# Patient Record
Sex: Female | Born: 1955 | Race: White | Hispanic: Yes | Marital: Married | Smoking: Never smoker
Health system: Southern US, Community
[De-identification: ages and names within clinical notes are randomized; demographics above are authoritative.]

## PROBLEM LIST (undated history)

## (undated) DIAGNOSIS — B1920 Unspecified viral hepatitis C without hepatic coma: Secondary | ICD-10-CM

## (undated) HISTORY — PX: KIDNEY STONE SURGERY: SHX686

## (undated) HISTORY — PX: CHOLECYSTECTOMY: SHX55

## (undated) HISTORY — PX: TONSILLECTOMY: SUR1361

---

## 1998-12-16 ENCOUNTER — Other Ambulatory Visit: Admission: RE | Admit: 1998-12-16 | Discharge: 1998-12-16 | Payer: Self-pay | Admitting: *Deleted

## 1999-08-09 ENCOUNTER — Ambulatory Visit (HOSPITAL_COMMUNITY): Admission: RE | Admit: 1999-08-09 | Discharge: 1999-08-09 | Payer: Self-pay | Admitting: *Deleted

## 1999-10-17 ENCOUNTER — Ambulatory Visit (HOSPITAL_COMMUNITY): Admission: RE | Admit: 1999-10-17 | Discharge: 1999-10-18 | Payer: Self-pay

## 2000-02-03 ENCOUNTER — Other Ambulatory Visit: Admission: RE | Admit: 2000-02-03 | Discharge: 2000-02-03 | Payer: Self-pay | Admitting: *Deleted

## 2001-03-14 ENCOUNTER — Other Ambulatory Visit: Admission: RE | Admit: 2001-03-14 | Discharge: 2001-03-14 | Payer: Self-pay | Admitting: *Deleted

## 2002-07-23 ENCOUNTER — Encounter: Payer: Self-pay | Admitting: Family Medicine

## 2002-07-23 ENCOUNTER — Encounter: Admission: RE | Admit: 2002-07-23 | Discharge: 2002-07-23 | Payer: Self-pay | Admitting: Family Medicine

## 2003-09-09 ENCOUNTER — Other Ambulatory Visit: Admission: RE | Admit: 2003-09-09 | Discharge: 2003-09-09 | Payer: Self-pay | Admitting: Obstetrics and Gynecology

## 2003-09-11 ENCOUNTER — Encounter: Admission: RE | Admit: 2003-09-11 | Discharge: 2003-09-11 | Payer: Self-pay | Admitting: Obstetrics and Gynecology

## 2003-09-17 ENCOUNTER — Encounter: Admission: RE | Admit: 2003-09-17 | Discharge: 2003-09-17 | Payer: Self-pay | Admitting: Obstetrics and Gynecology

## 2004-10-07 ENCOUNTER — Encounter: Admission: RE | Admit: 2004-10-07 | Discharge: 2004-10-07 | Payer: Self-pay | Admitting: Family Medicine

## 2004-10-19 ENCOUNTER — Ambulatory Visit (HOSPITAL_COMMUNITY): Admission: RE | Admit: 2004-10-19 | Discharge: 2004-10-19 | Payer: Self-pay | Admitting: Urology

## 2004-10-19 ENCOUNTER — Ambulatory Visit (HOSPITAL_BASED_OUTPATIENT_CLINIC_OR_DEPARTMENT_OTHER): Admission: RE | Admit: 2004-10-19 | Discharge: 2004-10-19 | Payer: Self-pay | Admitting: Urology

## 2005-08-09 IMAGING — CT CT PELVIS W/O CM
1 series · 15 of 32 positions shown, 19 images · IV contrast (agent unspecified)
Comparison: none

CLINICAL DATA: Abdominal pain particularly left flank for several days. 
 CT ABDOMEN W/O CONTRAST:
 Multidetector helical scans through the abdomen were performed in the unenhanced state.  The lung bases are clear.  There is left hydronephrosis and hydroureter into the bladder.  CT of the pelvis will be performed.  No right hydronephrosis is seen.  No renal calculi are noted.  The remainder of the study shows the liver to appear normal in the unenhanced state.  Surgical clips are present from prior cholecystectomy.  The pancreas appears grossly normal although there is some prominence to the pancreatic duct.  The adrenal glands and spleen appear normal.  The abdominal aorta is normal in caliber.

[Series 2: renal stone · axial · 0.66mm/px · z∈[-311,-1]mm · 15 of 67 slices shown, 19 images]
[im 5/67  soft-tissue]
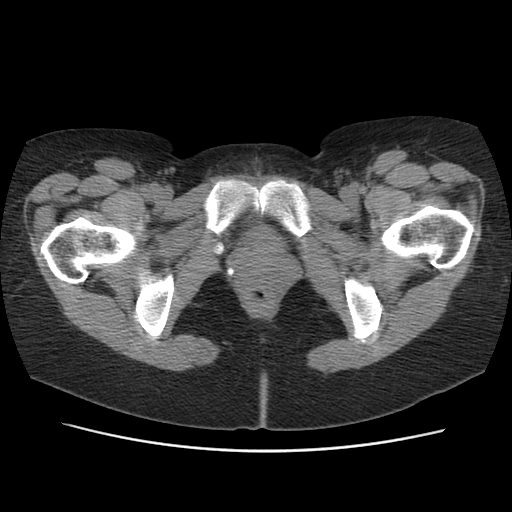
[im 5/67  bone]
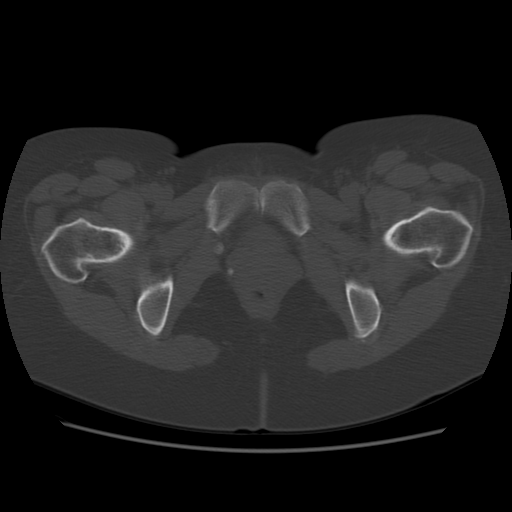
[im 9/67  soft-tissue]
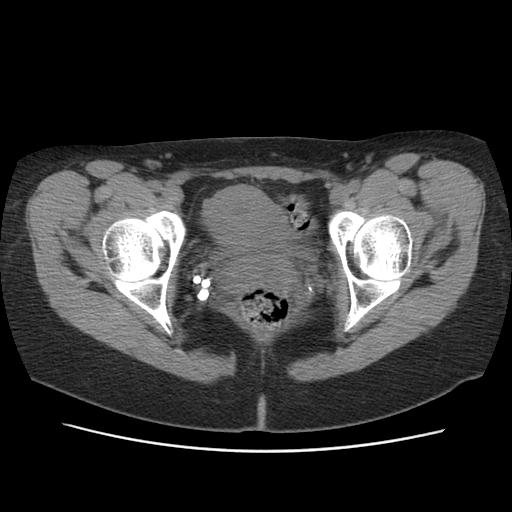
[im 13/67  soft-tissue]
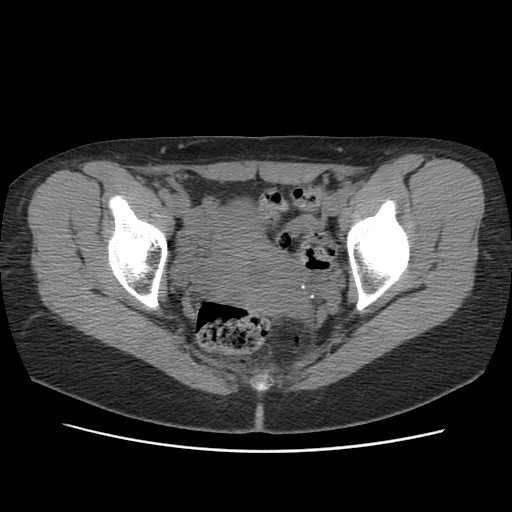
[im 20/67  soft-tissue]
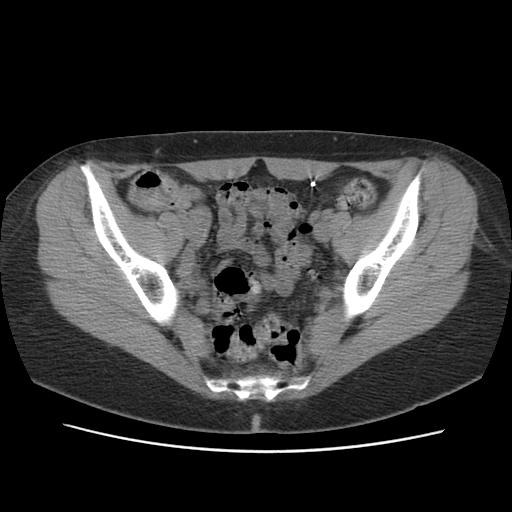
[im 24/67  soft-tissue]
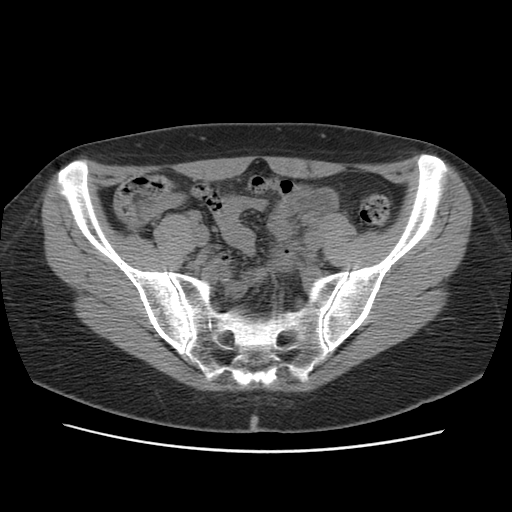
[im 28/67  soft-tissue]
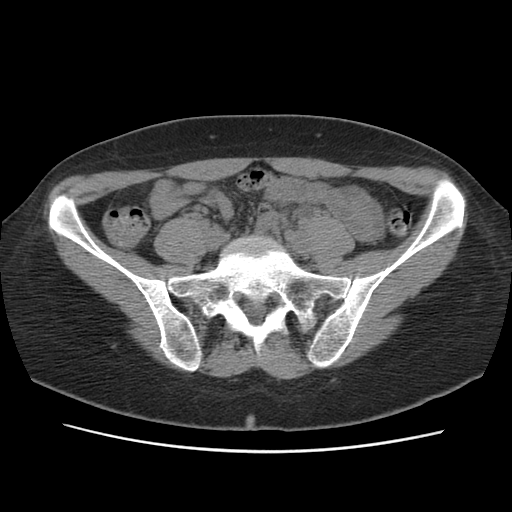
[im 35/67  soft-tissue]
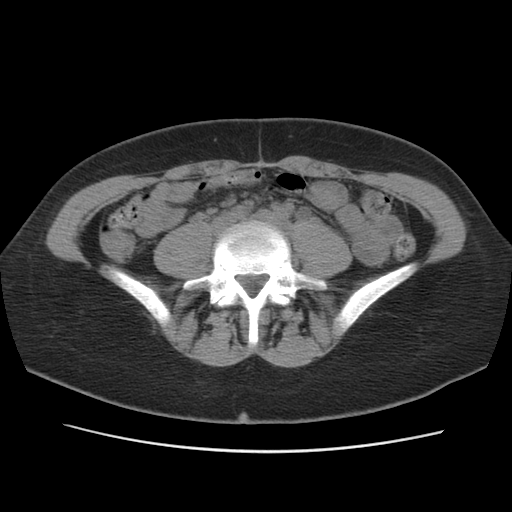
[im 39/67  soft-tissue]
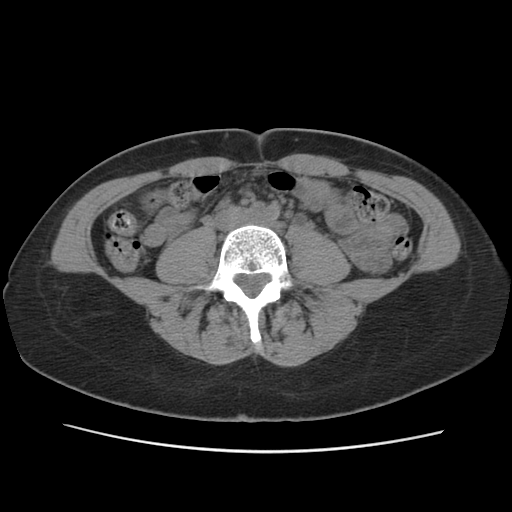
[im 43/67  soft-tissue]
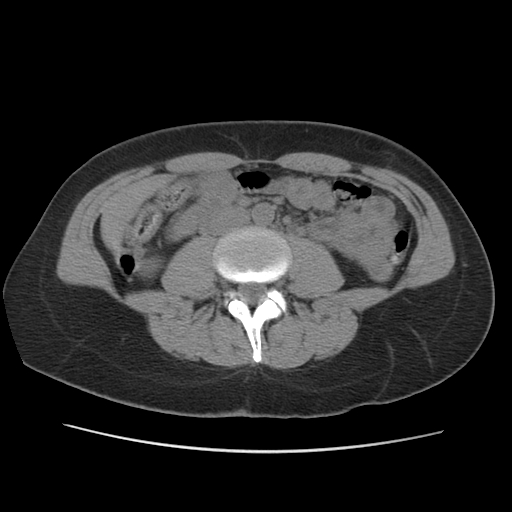
[im 43/67  bone]
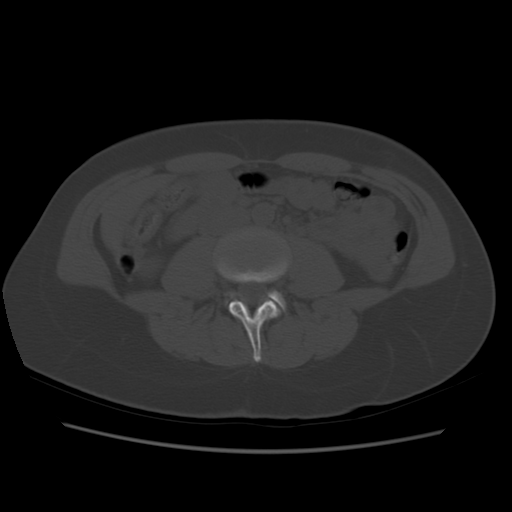
[im 47/67  soft-tissue]
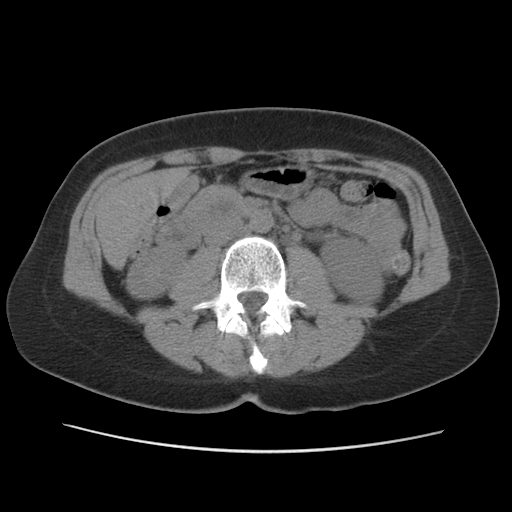
[im 54/67  soft-tissue]
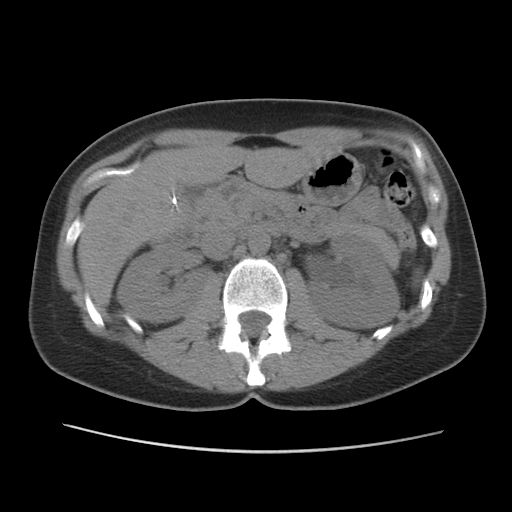
[im 58/67  soft-tissue]
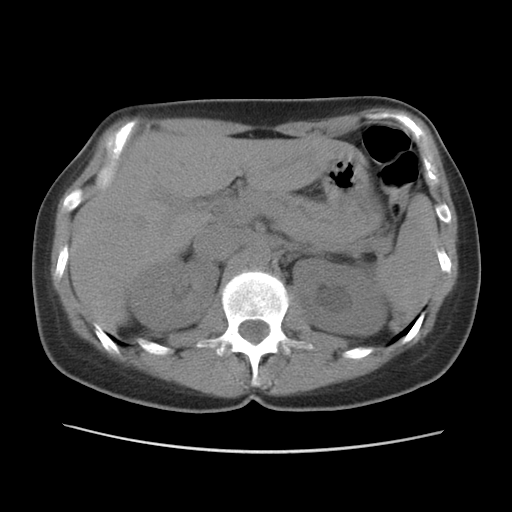
[im 58/67  lung]
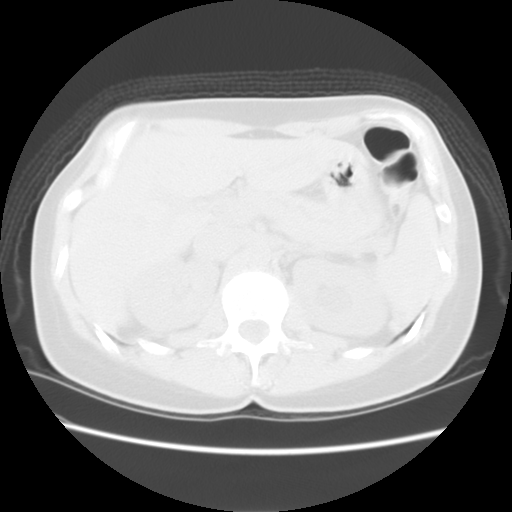
[im 60/67  lung]
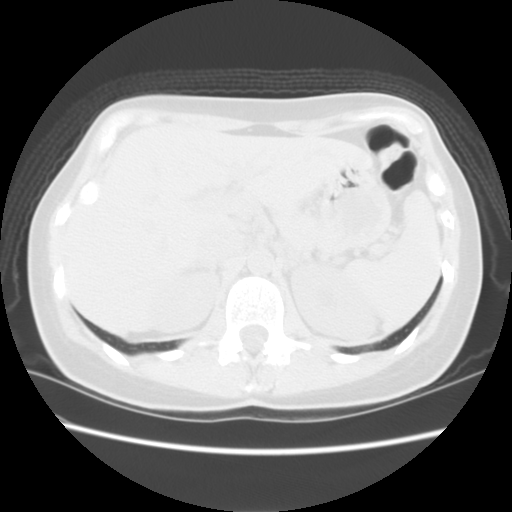
[im 62/67  soft-tissue]
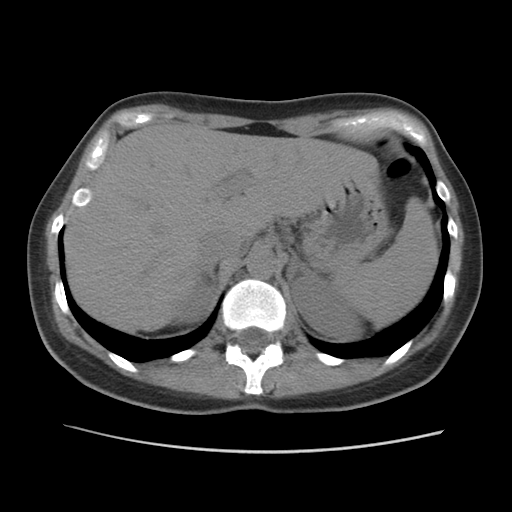
[im 62/67  lung]
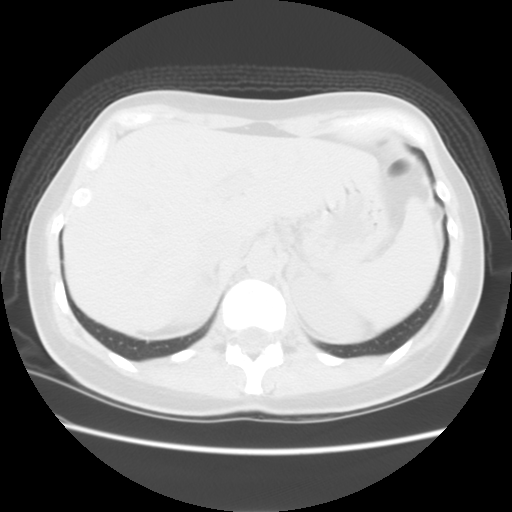
[im 64/67  lung]
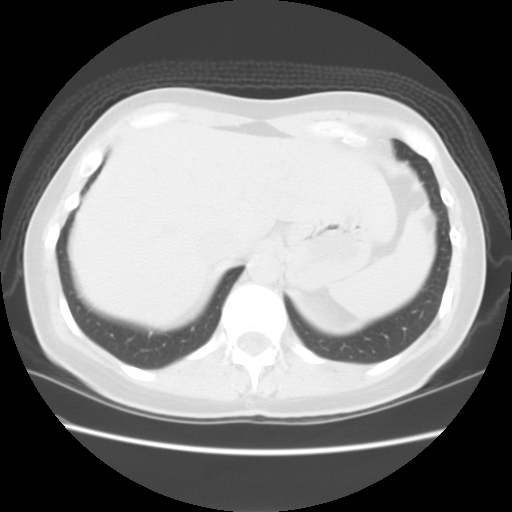

[15 of 32 positions shown; findings below may reference images not displayed]

IMPRESSION: 1.  Moderate left hydronephrosis and hydroureter.  CT of the pelvis is to be performed.  
 2.  No renal calculi are seen.  
 3.  Slightly prominent pancreatic duct.  Correlate clinically. 
 CT PELVIS W/CONTRAST: 
 Scans were continued through the pelvis in the unenhanced state.  The left hydronephrosis is due to a mid distal left ureteral calculus of 6 x 4mm.  The more distal left ureter is normal in caliber.  The right ureter is normal with no right ureteral calculi noted.  The urinary bladder is decompressed and unremarkable.  Calcified phleboliths are noted within the pelvis.  The uterus is normal in size.  Small ovarian follicles are noted.
IMPRESSION: Moderate left hydronephrosis and hydroureter to the mid pelvis where there is a 6 x 4mm obstructing left ureteral calculus present.

## 2006-06-01 ENCOUNTER — Encounter: Admission: RE | Admit: 2006-06-01 | Discharge: 2006-06-01 | Payer: Self-pay | Admitting: Family Medicine

## 2006-06-07 ENCOUNTER — Encounter: Admission: RE | Admit: 2006-06-07 | Discharge: 2006-06-07 | Payer: Self-pay | Admitting: Family Medicine

## 2006-12-10 ENCOUNTER — Emergency Department (HOSPITAL_COMMUNITY): Admission: EM | Admit: 2006-12-10 | Discharge: 2006-12-10 | Payer: Self-pay | Admitting: Emergency Medicine

## 2006-12-10 ENCOUNTER — Inpatient Hospital Stay (HOSPITAL_COMMUNITY): Admission: EM | Admit: 2006-12-10 | Discharge: 2006-12-11 | Payer: Self-pay | Admitting: Emergency Medicine

## 2008-10-06 ENCOUNTER — Encounter: Admission: RE | Admit: 2008-10-06 | Discharge: 2008-10-06 | Payer: Self-pay | Admitting: Family Medicine

## 2010-03-23 ENCOUNTER — Encounter: Admission: RE | Admit: 2010-03-23 | Discharge: 2010-03-23 | Payer: Self-pay | Admitting: Family Medicine

## 2010-11-26 ENCOUNTER — Encounter: Payer: Self-pay | Admitting: Obstetrics and Gynecology

## 2010-11-27 ENCOUNTER — Encounter: Payer: Self-pay | Admitting: Family Medicine

## 2011-03-24 NOTE — H&P (Signed)
NAMEGRACEMARIE, Tamara [name]                 ACCOUNT NO.:  [id number]   MEDICAL RECORD NO.:  [id number]          PATIENT TYPE:  LINP   LOCATION:                               FACILITY:  [facility]   PHYSICIAN:  Kela Millin, M.D.DATE OF BIRTH:  11/16/1955   DATE OF ADMISSION:  12/10/2006  DATE OF DISCHARGE:                              HISTORY & PHYSICAL   PRIMARY CARE PHYSICIAN:  Dr. Shaune Pollack.   CHIEF COMPLAINT:  1. Right upper quadrant pain.  2. Per ER physician, hypotension.   HISTORY OF PRESENT ILLNESS:  The patient is a 55 year old white female  with a past medical history significant for GERD, status post  cholecystectomy secondary to cholelithiasis/cholecystitis in 2005, also  status post left cystourethroscopy for ureterolithiasis in 2000, who  presents with above complaints.  The patient reports that she initially  came to the ER earlier this a.m. with complaints of nausea, vomiting as  well as diarrhea and just generalized pain - per ER physician, she was  hydrated, treated with IV analgesics and discharged home.  The patient  states that in the process of getting transferred from bed to wheelchair  and to her car she began feeling some right upper quadrant pain that was  precipitated by movement.  After she got home that right upper quadrant  pain was worse and so she came back to the ER.  Upon return to the ER,  the patient was found to have a low blood pressure of 86/53 and she  received IV Dilaudid as well for pain and upon recheck of the blood  pressures they had decreased even further to 76/33, so admission  requested to the [facility] for further evaluation and  management.  The patient describes the pain as somewhat sharp, in her  right upper quadrant and she only experiences the pain when she moves.  She states that she had multiple episodes of diarrhea last night, unable  to further qualify, but admits that it was non-bloody.  She states that  on the  day prior to admission she had gone all day long without eating  anything - states that it was just because she was upset, not because  she had any nausea or dysphagia or decreased appetite.  In the evening  she reports she ate some chocolate on an empty stomach and thought that  was the cause of the nausea and vomiting that she began experiencing.  She denies any sick contacts except that about 3 weeks ago her daughter  had similar symptoms but they had long resolved.  She admits to  subjective fevers.  She denies dysuria, cough, chest pain, melena and no  hematochezia.  The patient had a urinalysis done in the ER which was  negative for infection.  Upon return, she had a CT scan which showed no  acute findings and her point of care markers are negative, her lactic  acid level 0.8 and a chest x-ray negative for acute infiltrates.   PAST MEDICAL HISTORY:  As stated above.   MEDICATIONS:  1. Tums p.r.n.  2. Prilosec p.r.n.  3. Patient had also received Ativan, Zofran and morphine just prior to      being sent home from the ER earlier.   ALLERGIES:  MACROBID.   SOCIAL HISTORY:  She quit tobacco 24 years ago, occasional alcohol.   FAMILY HISTORY:  Cousin had type 1 diabetes - now deceased.   REVIEW OF SYSTEMS:  As per HPI.  Other review of systems negative.   PHYSICAL EXAM:  GENERAL:  The patient is an acutely ill middle-aged  white female in no respiratory distress.  VITAL SIGNS:  Temperature 100.3, blood pressure at its lowest 76/33, and  current blood pressure following IV fluids - 113/50, pulse 94,  respiratory rate 18, and O2 sat of 97%.  HEENT:  PERRL, EOMI, sclera anicteric, dry mucous membranes, no oral  exudates.  NECK:  Supple, no adenopathy and no thyromegaly, no JVD.  LUNGS:  Clear to auscultation bilaterally, no crackles or wheezes.  CARDIOVASCULAR:  Regular rate and rhythm, normal S1, S2.  ABDOMEN:  Soft, bowel sounds present, nontender, nondistended, no   organomegaly and no masses palpable.  EXTREMITIES:  No cyanosis and no edema.  NEURO:  She is alert and oriented x3.  Cranial nerves II-XII grossly  intact, nonfocal exam.   LABORATORY DATA:  A CT of abdomen and pelvis - no acute findings.  Stable intra/extrahepatic biliary duct dilatation.  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  The chest x-ray shows changes  compatible with COPD, no acute cardiopulmonary disease.  Point of care  markers are negative.  Lactic acid level 0.8.  White cell count is 11.1,  hemoglobin 14.6, hematocrit 42.3, platelet count 155,000, neutrophil  count 96%.  The sodium is 139 with a potassium of 3.7, chloride 105, CO2  of 25, glucose 145, BUN 11, creatinine of 0.86 and her total bilirubin  is 1.6, alkaline phosphatase is 79, SGOT 30, SGPT 26, calcium is 9.5.   ASSESSMENT AND PLAN:  1. Nausea, vomiting/diarrhea - patient also with right upper quadrant      pain as discussed above.  Probable gastroenteritis.  As noted      above, patient is status post cholecystectomy and CT scan of      abdomen negative for acute findings, urinalysis also negative.      Will also obtain a serum amylase and lipase to evaluate the      abdominal pain further.  Continue IV fluids, supportive care.  2. Hypotension - as discussed above.  Likely due to hypovolemia      secondary to the nausea, vomiting and diarrhea.  Will obtain stool      and blood cultures, also cardiac enzymes, follow hemoglobin and      hematocrit.  Continue hydration.  3. Gastroesophageal reflux disease - place patient on proton pump      inhibitor and follow.  4. History of ureterolithiasis - A CT scan of abdomen negative as      above.      Kela Millin, M.D.  Electronically Signed     ACV/MEDQ  D:  12/10/2006  T:  12/10/2006  Job:  [id number]  cc:   Duncan Dull, M.D.  Fax: [phone]

## 2011-03-24 NOTE — Op Note (Signed)
[name], [name]                 ACCOUNT NO.:  [id number]   MEDICAL RECORD NO.:  [id number]          PATIENT TYPE:  AMB   LOCATION:  [facility]                         FACILITY:  [facility]   PHYSICIAN:  Ronald L. Ovidio Hanger, M.D.DATE OF BIRTH:  05-11-1956   DATE OF PROCEDURE:  10/19/2004  DATE OF DISCHARGE:                                 OPERATIVE REPORT   PREOPERATIVE DIAGNOSES:  Left ureterolithiasis with hydronephrosis.   POSTOPERATIVE DIAGNOSES:  Left ureterolithiasis with hydronephrosis.   OPERATION:  Cystourethroscopy, left retrograde ureteropyelogram, left  ureteroscopy with holmium laser lithotripsy and stone basket extraction and  balloon dilatation of lower ureter and placement of left double J stent.   SURGEON:  Lucrezia Starch. Earlene Plater, M.D.   ANESTHESIA:  LMA.   ESTIMATED BLOOD LOSS:  Negligible.   TUBES:  26 cm 6 [place] Cook double pigtail stent.   COMPLICATIONS:  None.   INDICATIONS FOR PROCEDURE:  Tamara [name] is a lovely 55 year old white female  who essentially presented with left flank pain, nausea and vomiting. She was  found on CT scan to have a left hydronephrosis and a 4 x 6 mm stone at the  level of the vessels on the left side with obstruction.  She wanted to be  followed which we have carefully done. She has not had fever but she has had  intermittent pain and on re-CT scan she was found to have a highly  obstructed stone with hydronephrosis that appears to not be moving and we  figured leaving it further would cause renal damage.  Again she understands  the risks, benefits, and alternatives and has elected to proceed with the  above procedure.   DESCRIPTION OF PROCEDURE:  The patient was placed in supine position and  after proper LMA anesthesia was placed in the dorsal lithotomy position and  prepped and draped with Betadine in a sterile fashion. Cystourethroscopy was  performed with a 22.5 French Olympus panendoscope, the bladder was carefully  inspected with 12  and 70 degree lenses. Efflux of clear urine was noted from  the right ureteral orifice, the left ureteral orifice was in normal location  and no other lesions were noted.  Under fluoroscopic guidance, a 0.038  French guidewire was placed up to the stone and although the stone was very  faintly seen on fluoroscopy it could not be passed. A 6 French open ended  catheter was passed into the stone and dye was injected. It was noted to be  highly impacted but dye did go past it and the remainder of the ureter and  the renal collecting system was without lesions although there was  significant hydroureteronephrosis noted.  Utilizing a 0.038 [place]  Glidewire, the stone was bypassed, the open ended catheter was placed pass  the stone although it was quite impacted into the left renal pelvis. A  hydronephrotic drip was noted but it did not appear to be infected and again  dye was injected and no lesions were seen.  The 0.038 French Teflon coated  guidewire was placed into the left renal pelvis and the 6  [place] open ended  catheter was removed and under fluoroscopic guidance the lower ureter was  balloon dilated with a 10 cm 5 cm ureteral balloon dilator to 10 atmospheres  of pressure for two minutes. This was removed, the wire was maintained  utilizing a 6 French semirigid wolf ureteroscope. The lower ureter was  visualized up to the stone, the stone appeared to be impacted yellowish in  color but did appear to be partially fragmented.  Utilizing a 365 holmium  laser fiber with a power setting of 0.5 and a repetition rate of 5, the  stone was fragmented into multiple fragments approximately a millimeter  each. Utilizing a Nitinol basket, fragments were removed and will be  submitted for stone analysis and the remainder appeared to be just debris  that would not fit into the basket. The ureter above the stone was  visualized and the lower two-thirds ureter was visualized, no perforations  noted to  be present.  There were no significant fragments remaining.  No  strictures present. The scope was visually removed and under fluoroscopic  guidance, a 26 cm 6 French Cook double pigtail catheter was placed and noted  to be in good position within the left renal pelvis within the bladder. The  bladder was drained, panendoscope was removed, the patient was taken to the  recovery room stable. The stone will be submitted for stone analysis.     Rona   RLD/MEDQ  D:  10/19/2004  T:  10/19/2004  Job:  [id number]

## 2011-03-24 NOTE — Op Note (Signed)
[facility]  Patient:    Tamara [name]                         MRN: [id number] Proc. Date: 10/17/99 Adm. Date:  [id number] Attending:  Gennie Alma CC:         Ulyess Mort, M.D. [facility]                           Operative Report  CENTRAL  NUMBER:  [id number]  PREOPERATIVE DIAGNOSIS:  Chronic cholecystitis with cholelithiasis.  POSTOPERATIVE DIAGNOSIS:  Chronic cholecystitis with cholelithiasis.  OPERATION:  Laparoscopic cholecystectomy and intraoperative cholangiogram.  SURGEON:  Milus Mallick, M.D.  ASSISTANT:  Angelia Mould. Derrell Lolling, M.D.  ANESTHESIA:  General endotracheal.  DESCRIPTION OF PROCEDURE:  Under adequate general endotracheal anesthesia, the patients abdomen was prepared and draped in the usual fashion.  A short vertical incision was made in the inferior portion of the umbilicus and carried down to eep fascia, which was incised sharply.  The incision was made longitudinally.  The edges were picked up with Kocher clamps and the incision in the fascia was extended to approximately 1.5 cm in length.  The peritoneum was entered bluntly.  A pursestring suture of 0 Vicryl was applied to the fascia and used for a holding  suture for the umbilical port.  A 10 mm Hussan cannula was introduced into the abdomen under direct vision.  The abdomen was inflated with carbon monoxide to  15 mmHg with an automatic insufflator.  A laparoscopic videoscope was inserted nto the abdomen and there were no significant adhesions.  The gallbladder was chronically inflamed.  Using the video laparoscope for direct vision, a 10 mm upper midline port and two 5 mm subcostal ports were inserted.  The patient was placed in the reversed Trendelenburg position and turned to the  left.  ________ grasping forceps were placed down the subcostal ports.  The gallbladder was grasped and placed on traction superiorly.  Dissection was begun through  the upper midline port.  There were multiple adhesions on the gallbladder which were taken down bluntly and with some electrocoagulation.  The triangle of Calot was exposed.  The cystic duct was isolated and skeletonized.  A Endoclip as placed across the cystic duct and gallbladder junction.  A small incision was made in the cystic duct.  A Reddick catheter was introduced into the abdomen through a 14-gauge angiocath.  It was threaded into the cystic duct and held in place with an Endoclip.  An intraoperative cholangiogram was then done using 10 cc of 25% Hypaque and C-arm fluoroscopy.  This revealed normal size common bile duct with good flow of dye into the duodenum with no intraluminal filling defects.  The clip was removed from the cystic duct.  The Reddick catheter was withdrawn rom the cystic duct and from the abdomen.  The cystic duct was triply clipped below the incision in it, and then divided above the triple clip application.  The cystic  artery was isolated and skeletonized, triply clipped with Endoclips and transected between the distal two clips.  The gallbladder was dissected out of the gallbladder bed of the liver using the hook and spatula coagulation.  Hemostasis was intact. The gallbladder was then withdrawn into the umbilical port and the umbilical port was withdrawn from the abdomen, removing the neck of the gallbladder.  The fundus of the gallbladder would  not pass through the abdominal wall through the 1.5 cm  incision.  This was due to multiple large stones in the fundus.  The gallbladder was opened on the outside of the abdomen and multiple stones were withdrawn from the fundus.  However, the gallbladder would still not pass through the abdominal wall.  The umbilical fascial incision was extended to 2.5 cm with the scalpel.  The gallbladder then was able to pass through the abdominal wall, and was removed from the abdomen.  The pursestring  suture was tied and most of the incision was thereby approximated. However, the inferior 1 cm was still not approximated, and a single suture of  0 Vicryl was used to secure it.  The right subhepatic and subphrenic spaces were irrigated with sterile saline solution until clear.  The subcostal ports were withdrawn under direct vision. The under surface of the umbilical port closure was inspected and no adhesions were  found.  The abdomen was deflated.  The upper midline port was removed.  All of the port incisions were closed with subcuticular sutures of 5-0 Vicryl and 1/2 inch Steri-Strips.  Sterile dressings were applied to the skin.  The patient tolerated the procedure well and left the operating room in satisfactory condition. DD:  10/17/99 TD:  10/17/99 Job: [id number]

## 2012-03-23 ENCOUNTER — Emergency Department (HOSPITAL_COMMUNITY): Payer: No Typology Code available for payment source

## 2012-03-23 ENCOUNTER — Emergency Department (HOSPITAL_COMMUNITY)
Admission: EM | Admit: 2012-03-23 | Discharge: 2012-03-23 | Disposition: A | Discharge status: Home / Self Care | Payer: No Typology Code available for payment source | Attending: Emergency Medicine | Admitting: Emergency Medicine

## 2012-03-23 ENCOUNTER — Encounter (HOSPITAL_COMMUNITY): Payer: Self-pay | Admitting: Emergency Medicine

## 2012-03-23 DIAGNOSIS — S298XXA Other specified injuries of thorax, initial encounter: Secondary | ICD-10-CM | POA: Insufficient documentation

## 2012-03-23 DIAGNOSIS — R110 Nausea: Secondary | ICD-10-CM | POA: Insufficient documentation

## 2012-03-23 DIAGNOSIS — K5900 Constipation, unspecified: Secondary | ICD-10-CM | POA: Insufficient documentation

## 2012-03-23 DIAGNOSIS — R1011 Right upper quadrant pain: Secondary | ICD-10-CM | POA: Insufficient documentation

## 2012-03-23 DIAGNOSIS — R079 Chest pain, unspecified: Secondary | ICD-10-CM | POA: Insufficient documentation

## 2012-03-23 DIAGNOSIS — Z8619 Personal history of other infectious and parasitic diseases: Secondary | ICD-10-CM | POA: Insufficient documentation

## 2012-03-23 DIAGNOSIS — IMO0001 Reserved for inherently not codable concepts without codable children: Secondary | ICD-10-CM | POA: Insufficient documentation

## 2012-03-23 HISTORY — DX: Unspecified viral hepatitis C without hepatic coma: B19.20

## 2012-03-23 MED ORDER — HYDROCODONE-ACETAMINOPHEN 5-325 MG PO TABS: 1.0000 | ORAL_TABLET | ORAL | Status: AC | PRN
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
## 2012-03-23 NOTE — ED Provider Notes (Signed)
Medical screening examination/treatment/procedure(s) were performed by non-physician practitioner and as supervising physician I was immediately available for consultation/collaboration.   Dayton Bailiff, MD 03/23/12 1336
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
## 2012-03-23 NOTE — Discharge Instructions (Signed)
Your xrays did not show signs of any broken bones or injury to your lungs/diaphragm. This may be bruising or muscle pain. You have been given a prescription for pain medication. Use this as prescribed. You've also been given an incentive spirometer. Use this as directed by respiratory therapy. Please take a stool softener as well. Follow up with your primary care doctor next week. Return to the ER if you develop trouble breathing, worsening pain, or any other worrisome symptoms.  Abdominal Pain (Nonspecific) Your exam might not show the exact reason you have abdominal pain. Since there are many different causes of abdominal pain, another checkup and more tests may be needed. It is very important to follow up for lasting (persistent) or worsening symptoms. A possible cause of abdominal pain in any person who still has his or her appendix is acute appendicitis. Appendicitis is often hard to diagnose. Normal blood tests, urine tests, ultrasound, and CT scans do not completely rule out early appendicitis or other causes of abdominal pain. Sometimes, only the changes that happen over time will allow appendicitis and other causes of abdominal pain to be determined. Other potential problems that may require surgery may also take time to become more apparent. Because of this, it is important that you follow all of the instructions below. HOME CARE INSTRUCTIONS   Rest as much as possible.   Do not eat solid food until your pain is gone.   While adults or children have pain: A diet of water, weak decaffeinated tea, broth or bouillon, gelatin, oral rehydration solutions (ORS), frozen ice pops, or ice chips may be helpful.   When pain is gone in adults or children: Start a light diet (dry toast, crackers, applesauce, or white rice). Increase the diet slowly as long as it does not bother you. Eat no dairy products (including cheese and eggs) and no spicy, fatty, fried, or high-fiber foods.   Use no alcohol,  caffeine, or cigarettes.   Take your regular medicines unless your caregiver told you not to.   Take any prescribed medicine as directed.   Only take over-the-counter or prescription medicines for pain, discomfort, or fever as directed by your caregiver. Do not give aspirin to children.  If your caregiver has given you a follow-up appointment, it is very important to keep that appointment. Not keeping the appointment could result in a permanent injury and/or lasting (chronic) pain and/or disability. If there is any problem keeping the appointment, you must call to reschedule.  SEEK IMMEDIATE MEDICAL CARE IF:   Your pain is not gone in 24 hours.   Your pain becomes worse, changes location, or feels different.   You or your child has an oral temperature above 102 F (38.9 C), not controlled by medicine.   Your baby is older than 3 months with a rectal temperature of 102 F (38.9 C) or higher.   Your baby is 58 months old or younger with a rectal temperature of 100.4 F (38 C) or higher.   You have shaking chills.   You keep throwing up (vomiting) or cannot drink liquids.   There is blood in your vomit or you see blood in your bowel movements.   Your bowel movements become dark or black.   You have frequent bowel movements.   Your bowel movements stop (become blocked) or you cannot pass gas.   You have bloody, frequent, or painful urination.   You have yellow discoloration in the skin or whites of the eyes.  Your stomach becomes bloated or bigger.   You have dizziness or fainting.   You have chest or back pain.  MAKE SURE YOU:   Understand these instructions.   Will watch your condition.   Will get help right away if you are not doing well or get worse.  Document Released: 10/23/2005 Document Revised: 10/12/2011 Document Reviewed: 09/20/2009 [facility] Patient Information 2012 [place], [place].

## 2012-03-28 ENCOUNTER — Other Ambulatory Visit: Payer: Self-pay | Admitting: Family Medicine

## 2012-03-28 DIAGNOSIS — R202 Paresthesia of skin: Secondary | ICD-10-CM

## 2012-08-02 ENCOUNTER — Other Ambulatory Visit: Payer: Self-pay | Admitting: Family Medicine

## 2012-08-02 DIAGNOSIS — Z1231 Encounter for screening mammogram for malignant neoplasm of breast: Secondary | ICD-10-CM

## 2012-08-22 ENCOUNTER — Ambulatory Visit
Admission: RE | Admit: 2012-08-22 | Discharge: 2012-08-22 | Disposition: A | Discharge status: Home / Self Care | Payer: BC Managed Care – PPO | Source: Ambulatory Visit | Attending: Family Medicine | Admitting: Family Medicine

## 2012-08-22 DIAGNOSIS — Z1231 Encounter for screening mammogram for malignant neoplasm of breast: Secondary | ICD-10-CM

## 2013-09-18 ENCOUNTER — Ambulatory Visit
Admission: RE | Admit: 2013-09-18 | Discharge: 2013-09-18 | Disposition: A | Discharge status: Home / Self Care | Payer: 59 | Source: Ambulatory Visit | Attending: Family Medicine | Admitting: Family Medicine

## 2013-09-18 ENCOUNTER — Other Ambulatory Visit: Payer: Self-pay | Admitting: Family Medicine

## 2013-09-18 DIAGNOSIS — M545 Low back pain: Secondary | ICD-10-CM

## 2014-08-28 ENCOUNTER — Other Ambulatory Visit: Payer: Self-pay

## 2014-08-28 DIAGNOSIS — Z1231 Encounter for screening mammogram for malignant neoplasm of breast: Secondary | ICD-10-CM

## 2014-09-25 ENCOUNTER — Ambulatory Visit
Admission: RE | Admit: 2014-09-25 | Discharge: 2014-09-25 | Disposition: A | Discharge status: Home / Self Care | Payer: 59 | Source: Ambulatory Visit

## 2014-09-25 ENCOUNTER — Other Ambulatory Visit: Payer: Self-pay

## 2014-09-25 DIAGNOSIS — Z1231 Encounter for screening mammogram for malignant neoplasm of breast: Secondary | ICD-10-CM

## 2014-09-28 ENCOUNTER — Ambulatory Visit: Payer: 59

## 2014-09-30 ENCOUNTER — Other Ambulatory Visit: Payer: Self-pay | Admitting: Family Medicine

## 2014-09-30 DIAGNOSIS — R928 Other abnormal and inconclusive findings on diagnostic imaging of breast: Secondary | ICD-10-CM

## 2014-10-08 ENCOUNTER — Ambulatory Visit
Admission: RE | Admit: 2014-10-08 | Discharge: 2014-10-08 | Disposition: A | Discharge status: Home / Self Care | Payer: 59 | Source: Ambulatory Visit | Attending: Family Medicine | Admitting: Family Medicine

## 2014-10-08 DIAGNOSIS — R928 Other abnormal and inconclusive findings on diagnostic imaging of breast: Secondary | ICD-10-CM

## 2016-03-27 ENCOUNTER — Telehealth: Payer: Self-pay | Admitting: Internal Medicine

## 2016-07-17 NOTE — Telephone Encounter (Signed)
04/20/16 Dr. Leone PayorGessner has accepted patient. Dr. Leone PayorGessner is wanting to know how old patient mother was when she had colon cancer. Patient states that she will ask her mother and call our office back.  Patient has not called our office back. Records will be in "records reviewed" folder.

## 2016-09-25 ENCOUNTER — Ambulatory Visit (INDEPENDENT_AMBULATORY_CARE_PROVIDER_SITE_OTHER): Payer: 59 | Admitting: Physician Assistant

## 2016-09-25 VITALS — BP 140/88 | HR 61 | Temp 98.2°F | Resp 18 | Ht 64.5 in | Wt 187.0 lb

## 2016-09-25 DIAGNOSIS — T25212A Burn of second degree of left ankle, initial encounter: Secondary | ICD-10-CM

## 2016-09-25 DIAGNOSIS — M792 Neuralgia and neuritis, unspecified: Secondary | ICD-10-CM

## 2016-09-25 DIAGNOSIS — L03818 Cellulitis of other sites: Secondary | ICD-10-CM | POA: Diagnosis not present

## 2016-09-25 DIAGNOSIS — Z23 Encounter for immunization: Secondary | ICD-10-CM

## 2016-09-25 MED ORDER — SILVER SULFADIAZINE 1 % EX CREA: 1.0000 "application " | TOPICAL_CREAM | Freq: Every day | CUTANEOUS | 0 refills | Status: AC

## 2016-09-25 MED ORDER — CEFADROXIL 500 MG PO CAPS: 500.0000 mg | ORAL_CAPSULE | Freq: Two times a day (BID) | ORAL | 0 refills | Status: AC
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
## 2016-10-02 ENCOUNTER — Telehealth: Payer: Self-pay | Admitting: Physician Assistant

## 2016-10-02 NOTE — Telephone Encounter (Signed)
Please call this patient. Her husband contacted the answering service last night at 11:50 pm, reporting that she was in pain and needed medication for it. When the on-call provider called back, they were told that it had been taken care of.  Please see that the patient's issue was addressed.  Since she was last seen here 11/20, she'd need to RTC if she's still in pain.

## 2016-10-03 ENCOUNTER — Telehealth: Payer: Self-pay | Admitting: Emergency Medicine

## 2016-10-03 MED ORDER — IBUPROFEN 200 MG PO TABS: 600.0000 mg | ORAL_TABLET | Freq: Four times a day (QID) | ORAL | 0 refills | Status: DC | PRN

## 2016-10-03 MED ORDER — IBUPROFEN 200 MG PO TABS: 600.0000 mg | ORAL_TABLET | Freq: Four times a day (QID) | ORAL | 0 refills | Status: AC | PRN

## 2016-10-03 NOTE — Telephone Encounter (Signed)
Patient called after hours line requesting refill of Ibuprofen.  Recent phone calls reviewed.  I called patient and left message; [facility] [place]  [place] in [place] yet no specific pharmacy requested to send refill.

## 2016-10-03 NOTE — Telephone Encounter (Signed)
Patient states the wound is healing and the swelling has improved with medication/cream C/o left ankle shooting pain with ambulation and when wound is exposed with showering' Requesting medication refill Ibuprofen for PRN pain. She is currently in [place]  Will route message to [place] I refilled her medication and told her to return to clinic if pain has not improved

## 2016-10-04 ENCOUNTER — Telehealth: Payer: Self-pay

## 2016-10-04 NOTE — Telephone Encounter (Signed)
Pt husband called to tell us that the meds needs to be called into [facility]  [phone]  Phone number [phone]  Best number [phone]  Pt states this has been going on for four days now

## 2016-10-05 MED ORDER — IBUPROFEN 600 MG PO TABS: 600.0000 mg | ORAL_TABLET | Freq: Three times a day (TID) | ORAL | 0 refills | Status: AC | PRN

## 2016-10-05 NOTE — Telephone Encounter (Signed)
Called and spoke with patient. Her wound is healing and swelling is improving. She has stopped cream as it was causing too much pain. Still c/o pain, "burning" and "stinging" - however this too is improving. She says she called her pharmacy and the prescription for ibuprofen was not there. I sent it again. Discussed using PRN for pain. She mainly needs it at night.

## 2016-10-05 NOTE — Telephone Encounter (Signed)
Pt notified medication is ready for pick up Ibuprofen called in to pharmacy

## 2017-07-26 NOTE — Telephone Encounter (Signed)
Patient has not returned phone calls. Records have been shredded. °
